# Patient Record
Sex: Female | Born: 1980 | Race: White | Hispanic: No | Marital: Single | Smoking: Current every day smoker
Health system: Southern US, Community
[De-identification: ages and names within clinical notes are randomized; demographics above are authoritative.]

## PROBLEM LIST (undated history)

## (undated) DIAGNOSIS — M542 Cervicalgia: Secondary | ICD-10-CM

## (undated) DIAGNOSIS — R109 Unspecified abdominal pain: Secondary | ICD-10-CM

## (undated) DIAGNOSIS — I471 Supraventricular tachycardia: Secondary | ICD-10-CM

## (undated) DIAGNOSIS — I4719 Other supraventricular tachycardia: Secondary | ICD-10-CM

## (undated) DIAGNOSIS — G8929 Other chronic pain: Secondary | ICD-10-CM

## (undated) HISTORY — PX: INDUCED ABORTION: SHX677

## (undated) HISTORY — PX: ABDOMINAL HYSTERECTOMY: SHX81

## (undated) HISTORY — PX: CHOLECYSTECTOMY: SHX55

---

## 2013-05-08 ENCOUNTER — Emergency Department (HOSPITAL_COMMUNITY): Payer: Self-pay

## 2013-05-08 ENCOUNTER — Emergency Department (HOSPITAL_COMMUNITY)
Admission: EM | Admit: 2013-05-08 | Discharge: 2013-05-08 | Disposition: A | Discharge status: Home / Self Care | Payer: Self-pay | Attending: Emergency Medicine | Admitting: Emergency Medicine

## 2013-05-08 ENCOUNTER — Encounter (HOSPITAL_COMMUNITY): Payer: Self-pay

## 2013-05-08 DIAGNOSIS — Z880 Allergy status to penicillin: Secondary | ICD-10-CM | POA: Insufficient documentation

## 2013-05-08 DIAGNOSIS — Z3202 Encounter for pregnancy test, result negative: Secondary | ICD-10-CM | POA: Insufficient documentation

## 2013-05-08 DIAGNOSIS — R112 Nausea with vomiting, unspecified: Secondary | ICD-10-CM | POA: Insufficient documentation

## 2013-05-08 DIAGNOSIS — K529 Noninfective gastroenteritis and colitis, unspecified: Secondary | ICD-10-CM

## 2013-05-08 DIAGNOSIS — G8929 Other chronic pain: Secondary | ICD-10-CM | POA: Insufficient documentation

## 2013-05-08 DIAGNOSIS — F17200 Nicotine dependence, unspecified, uncomplicated: Secondary | ICD-10-CM | POA: Insufficient documentation

## 2013-05-08 DIAGNOSIS — N83202 Unspecified ovarian cyst, left side: Secondary | ICD-10-CM

## 2013-05-08 DIAGNOSIS — N83209 Unspecified ovarian cyst, unspecified side: Secondary | ICD-10-CM | POA: Insufficient documentation

## 2013-05-08 DIAGNOSIS — K5289 Other specified noninfective gastroenteritis and colitis: Secondary | ICD-10-CM | POA: Insufficient documentation

## 2013-05-08 DIAGNOSIS — R1084 Generalized abdominal pain: Secondary | ICD-10-CM | POA: Insufficient documentation

## 2013-05-08 HISTORY — DX: Other chronic pain: G89.29

## 2013-05-08 HISTORY — DX: Other supraventricular tachycardia: I47.19

## 2013-05-08 HISTORY — DX: Unspecified abdominal pain: R10.9

## 2013-05-08 HISTORY — DX: Supraventricular tachycardia: I47.1

## 2013-05-08 HISTORY — DX: Cervicalgia: M54.2

## 2013-05-08 LAB — URINALYSIS, ROUTINE W REFLEX MICROSCOPIC
Bilirubin Urine: NEGATIVE
Ketones, ur: NEGATIVE mg/dL
Leukocytes, UA: NEGATIVE
Nitrite: NEGATIVE
Protein, ur: NEGATIVE mg/dL
Urobilinogen, UA: 0.2 mg/dL (ref 0.0–1.0)

## 2013-05-08 LAB — CBC WITH DIFFERENTIAL/PLATELET
Basophils Relative: 0 % (ref 0–1)
Eosinophils Absolute: 0.1 10*3/uL (ref 0.0–0.7)
Eosinophils Relative: 1 % (ref 0–5)
Lymphs Abs: 2.1 10*3/uL (ref 0.7–4.0)
MCH: 27 pg (ref 26.0–34.0)
MCHC: 32.3 g/dL (ref 30.0–36.0)
MCV: 83.7 fL (ref 78.0–100.0)
Neutrophils Relative %: 69 % (ref 43–77)
Platelets: 258 10*3/uL (ref 150–400)
RBC: 4.18 MIL/uL (ref 3.87–5.11)

## 2013-05-08 LAB — HEPATIC FUNCTION PANEL
Alkaline Phosphatase: 65 U/L (ref 39–117)
Total Bilirubin: <0.1 mg/dL — ABNORMAL LOW (ref 0.3–1.2)
Total Protein: 8.5 g/dL — ABNORMAL HIGH (ref 6.0–8.3)

## 2013-05-08 LAB — BASIC METABOLIC PANEL
BUN: 6 mg/dL (ref 6–23)
Calcium: 9.1 mg/dL (ref 8.4–10.5)
GFR calc Af Amer: >90 mL/min (ref >90)
GFR calc non Af Amer: >90 mL/min (ref >90)
Glucose, Bld: 91 mg/dL (ref 70–99)
Potassium: 3.4 mEq/L — ABNORMAL LOW (ref 3.5–5.1)
Sodium: 139 mEq/L (ref 135–145)

## 2013-05-08 MED ORDER — ONDANSETRON HCL 4 MG/2ML IJ SOLN
4.0000 mg | INTRAMUSCULAR | Status: DC | PRN
  Administered 2013-05-08: 4 mg via INTRAVENOUS
  Filled 2013-05-08: qty 2

## 2013-05-08 MED ORDER — POTASSIUM CHLORIDE 20 MEQ/15ML (10%) PO LIQD
40.0000 meq | Freq: Once | ORAL | Status: DC
  Filled 2013-05-08: qty 30

## 2013-05-08 MED ORDER — FENTANYL CITRATE 0.05 MG/ML IJ SOLN
50.0000 ug | INTRAMUSCULAR | Status: DC | PRN
  Administered 2013-05-08: 50 ug via INTRAVENOUS
  Filled 2013-05-08 (×2): qty 2

## 2013-05-08 MED ORDER — IOHEXOL 300 MG/ML  SOLN
100.0000 mL | Freq: Once | INTRAMUSCULAR | Status: AC | PRN
  Administered 2013-05-08: 100 mL via INTRAVENOUS

## 2013-05-08 MED ORDER — CIPROFLOXACIN HCL 500 MG PO TABS: 500.0000 mg | ORAL_TABLET | Freq: Two times a day (BID) | ORAL | Status: DC

## 2013-05-08 MED ORDER — IOHEXOL 300 MG/ML  SOLN
50.0000 mL | Freq: Once | INTRAMUSCULAR | Status: AC | PRN
  Administered 2013-05-08: 50 mL via ORAL

## 2013-05-08 MED ORDER — METRONIDAZOLE 500 MG PO TABS: 500.0000 mg | ORAL_TABLET | Freq: Three times a day (TID) | ORAL | Status: DC

## 2013-05-08 MED ORDER — SODIUM CHLORIDE 0.9 % IV SOLN
INTRAVENOUS | Status: DC
  Administered 2013-05-08: 16:00:00 via INTRAVENOUS

## 2013-05-08 MED ORDER — POTASSIUM CHLORIDE CRYS ER 20 MEQ PO TBCR
EXTENDED_RELEASE_TABLET | ORAL | Status: AC
  Administered 2013-05-08: 40 meq
  Filled 2013-05-08: qty 2

## 2013-05-08 MED ORDER — HYDROCODONE-ACETAMINOPHEN 5-325 MG PO TABS: ORAL_TABLET | ORAL | Status: DC

## 2013-05-08 MED ORDER — NAPROXEN 250 MG PO TABS: 250.0000 mg | ORAL_TABLET | Freq: Two times a day (BID) | ORAL | Status: DC

## 2013-05-08 MED ORDER — FENTANYL CITRATE 0.05 MG/ML IJ SOLN
50.0000 ug | INTRAMUSCULAR | Status: DC | PRN
  Administered 2013-05-08: 50 ug via INTRAVENOUS

## 2013-05-08 NOTE — ED Provider Notes (Signed)
CSN: [id number]     Arrival date & time 05/08/13  1252 History   First MD Initiated Contact with Patient 05/08/13 1447     Chief Complaint  Patient presents with  . Abdominal Pain    HPI Pt was seen at 1455.  Per pt, c/o gradual onset and persistence of constant acute flair of her chronic generalized abd "pain" for the past 2.5 years. States the pain began approx 6 months after she had a hysterectomy 2.5 years ago. Has been associated with nausea, vomited x1 today.  Describes the abd pain as "squeezing," and "sharp." Pt states she has not followed up with any MD for same, including the OB/GYN who performed her hysterectomy.  Denies diarrhea, no fevers, no back pain, no rash, no CP/SOB, no black or blood in stools or emesis, no vaginal bleeding/discharge, no dysuria/hematuria.    Past Medical History  Diagnosis Date  . Chronic abdominal pain   . Atrial tachycardia   . Chronic neck pain    Past Surgical History  Procedure Laterality Date  . Abdominal hysterectomy    . Induced abortion    . Cholecystectomy      History  Substance Use Topics  . Smoking status: Current Every Day Smoker    Types: Cigarettes  . Smokeless tobacco: Not on file  . Alcohol Use: Yes    Review of Systems ROS: Statement: All systems negative except as marked or noted in the HPI; Constitutional: Negative for fever and chills. ; ; Eyes: Negative for eye pain, redness and discharge. ; ; ENMT: Negative for ear pain, hoarseness, nasal congestion, sinus pressure and sore throat. ; ; Cardiovascular: Negative for chest pain, palpitations, diaphoresis, dyspnea and peripheral edema. ; ; Respiratory: Negative for cough, wheezing and stridor. ; ; Gastrointestinal: +N/V, abd pain. Negative for diarrhea, blood in stool, hematemesis, jaundice and rectal bleeding. . ; ; Genitourinary: Negative for dysuria, flank pain and hematuria. ; ; GYN:  No vaginal bleeding, no vaginal discharge, no vulvar pain.;; Musculoskeletal: Negative  for back pain and neck pain. Negative for swelling and trauma.; ; Skin: Negative for pruritus, rash, abrasions, blisters, bruising and skin lesion.; ; Neuro: Negative for headache, lightheadedness and neck stiffness. Negative for weakness, altered level of consciousness , altered mental status, extremity weakness, paresthesias, involuntary movement, seizure and syncope.        Allergies  Penicillins; Amoxicillin; and Orange fruit  Home Medications  No current outpatient prescriptions on file. BP 124/86  Pulse 112  Temp(Src) 98 F (36.7 C) (Oral)  Resp 20  Ht 5\' 2"  (1.575 m)  Wt 125 lb (56.7 kg)  BMI 22.86 kg/m2  SpO2 99% BP 113/74  Pulse 99  Temp(Src) 98.5 F (36.9 C) (Oral)  Resp 18  Ht 5\' 2"  (1.575 m)  Wt 125 lb (56.7 kg)  BMI 22.86 kg/m2  SpO2 100%  Physical Exam 1500: Physical examination:  Nursing notes reviewed; Vital signs and O2 SAT reviewed;  Constitutional: Well developed, Well nourished, Well hydrated, In no acute distress; Head:  Normocephalic, atraumatic; Eyes: EOMI, PERRL, No scleral icterus; ENMT: Mouth and pharynx normal, Mucous membranes moist; Neck: Supple, Full range of motion, No lymphadenopathy; Cardiovascular: Regular rate and rhythm, No murmur, rub, or gallop; Respiratory: Breath sounds clear & equal bilaterally, No rales, rhonchi, wheezes.  Speaking full sentences with ease, Normal respiratory effort/excursion; Chest: Nontender, Movement normal; Abdomen: Soft, Nontender, Nondistended, Normal bowel sounds; Genitourinary: No CVA tenderness; Extremities: Pulses normal, No tenderness, No edema, No calf edema or  asymmetry.; Neuro: AA&Ox3, Major CN grossly intact.  Speech clear. Climbs on and off stretcher easily by herself. Gait upright and steady. No gross focal motor or sensory deficits in extremities.; Skin: Color normal, Warm, Dry.   ED Course  Procedures     MDM  MDM Reviewed: nursing note and vitals Interpretation: labs, CT scan and  ultrasound     Results for orders placed during the hospital encounter of 05/08/13  URINALYSIS, ROUTINE W REFLEX MICROSCOPIC      Result Value Range   Color, Urine YELLOW  YELLOW   APPearance CLEAR  CLEAR   Specific Gravity, Urine 1.025  1.005 - 1.030   pH 7.0  5.0 - 8.0   Glucose, UA NEGATIVE  NEGATIVE mg/dL   Hgb urine dipstick NEGATIVE  NEGATIVE   Bilirubin Urine NEGATIVE  NEGATIVE   Ketones, ur NEGATIVE  NEGATIVE mg/dL   Protein, ur NEGATIVE  NEGATIVE mg/dL   Urobilinogen, UA 0.2  0.0 - 1.0 mg/dL   Nitrite NEGATIVE  NEGATIVE   Leukocytes, UA NEGATIVE  NEGATIVE  PREGNANCY, URINE      Result Value Range   Preg Test, Ur NEGATIVE  NEGATIVE  CBC WITH DIFFERENTIAL      Result Value Range   WBC 8.1  4.0 - 10.5 K/uL   RBC 4.18  3.87 - 5.11 MIL/uL   Hemoglobin 11.3 (*) 12.0 - 15.0 g/dL   HCT 16.1 (*) 09.6 - 04.5 %   MCV 83.7  78.0 - 100.0 fL   MCH 27.0  26.0 - 34.0 pg   MCHC 32.3  30.0 - 36.0 g/dL   RDW 40.9  81.1 - 91.4 %   Platelets 258  150 - 400 K/uL   Neutrophils Relative % 69  43 - 77 %   Neutro Abs 5.6  1.7 - 7.7 K/uL   Lymphocytes Relative 26  12 - 46 %   Lymphs Abs 2.1  0.7 - 4.0 K/uL   Monocytes Relative 4  3 - 12 %   Monocytes Absolute 0.3  0.1 - 1.0 K/uL   Eosinophils Relative 1  0 - 5 %   Eosinophils Absolute 0.1  0.0 - 0.7 K/uL   Basophils Relative 0  0 - 1 %   Basophils Absolute 0.0  0.0 - 0.1 K/uL  BASIC METABOLIC PANEL      Result Value Range   Sodium 139  135 - 145 mEq/L   Potassium 3.4 (*) 3.5 - 5.1 mEq/L   Chloride 102  96 - 112 mEq/L   CO2 24  19 - 32 mEq/L   Glucose, Bld 91  70 - 99 mg/dL   BUN 6  6 - 23 mg/dL   Creatinine, Ser 7.82  0.50 - 1.10 mg/dL   Calcium 9.1  8.4 - 95.6 mg/dL   GFR calc non Af Amer >90  >90 mL/min   GFR calc Af Amer >90  >90 mL/min  LIPASE, BLOOD      Result Value Range   Lipase 22  11 - 59 U/L  HEPATIC FUNCTION PANEL      Result Value Range   Total Protein 8.5 (*) 6.0 - 8.3 g/dL   Albumin 4.1  3.5 - 5.2 g/dL   AST  23  0 - 37 U/L   ALT 12  0 - 35 U/L   Alkaline Phosphatase 65  39 - 117 U/L   Total Bilirubin <0.1 (*) 0.3 - 1.2 mg/dL   Bilirubin, Direct <2.1  0.0 -  0.3 mg/dL   Indirect Bilirubin NOT CALCULATED  0.3 - 0.9 mg/dL   Ct Abdomen Pelvis W Contrast 05/08/2013   CLINICAL DATA:  Abdominal pain  EXAM: CT ABDOMEN AND PELVIS WITH CONTRAST  TECHNIQUE: Multidetector CT imaging of the abdomen and pelvis was performed using the standard protocol following bolus administration of intravenous contrast. Oral contrast was also administered.  CONTRAST:  OMNIPAQUE IOHEXOL 300 MG/ML  SOLN  COMPARISON:  None.  FINDINGS: Lung bases are clear.  There is a 1.2 x 0.7 cm apparent cyst in the anterior segment of the right lobe of the liver. No other liver lesions are identified. There is no biliary duct dilatation. Gallbladder is absent.  Spleen, pancreas, and adrenals appear normal. Kidneys bilaterally show no mass or hydronephrosis on either side.  In the pelvis, the uterus is absent. Within the left ovary, there is a 2.9 x 2.5 cm mass with a somewhat irregularly enhancing wall. There is no other pelvic mass. There is no pelvic fluid. Appendix is not convincingly seen. There is no periappendiceal region inflammatory change.  There is no bowel obstruction. No free air or portal venous air.  Several areas of mild wall thickening are noted in the descending and proximal sigmoid colon, probably representing a degree of colitis. There is no surrounding mesenteric inflammation or abscess, however.  There is no ascites, adenopathy, or abscess in the abdomen or pelvis. Aorta is non aneurysmal. There are no blastic or lytic bone lesions.  There is mild colonic wall thickening  IMPRESSION: Small mass in the left ovary with a slightly irregularly enhancing wall. Suspect hemorrhagic cyst. This finding may warrant correlation with pelvic ultrasound to further assess, however.  Mild wall thickening in portions of the left colon, upright  representing a degree of colitis. No diverticulitis seen.  No abscess. No bowel obstruction.  Appendix is not convincingly seen. No periappendiceal region inflammation identified.  Gallbladder and uterus absent.  Small cyst in liver.   Electronically Signed   By: Bretta Bang   On: 05/08/2013 16:43   [place] Art/ven Flow Abd Pelv Doppler 05/08/2013   CLINICAL DATA:  Pelvic pain. Evaluate for potential ovarian torsion.  EXAM: TRANSABDOMINAL AND TRANSVAGINAL ULTRASOUND OF PELVIS  DOPPLER ULTRASOUND OF OVARIES  TECHNIQUE: Both transabdominal and transvaginal ultrasound examinations of the pelvis were performed. Transabdominal technique was performed for global imaging of the pelvis including uterus, ovaries, adnexal regions, and pelvic cul-de-sac.  It was necessary to proceed with endovaginal exam following the transabdominal exam to visualize the ovaries. Color and duplex Doppler ultrasound was utilized to evaluate blood flow to the ovaries.  COMPARISON:  CT of the abdomen and pelvis 05/08/2013.  FINDINGS: Uterus  Status post hysterectomy.  Right ovary  Right ovary could not be visualized.  Left ovary  Measurements: 4.5 x 2.3 x 3.4 cm. Within the right ovary there is a complex area of heterogeneous echotexture measuring 2.6 x 2.0 x 2.9 cm.  Pulsed Doppler evaluation of both ovaries demonstrates normal low-resistance arterial and venous waveforms.  No free fluid within the cul-de-sac.  IMPRESSION: 1. No evidence of left ovarian torsion. 2. Limited examination which did not visualize the right ovary. 3. Complex lesion in the left ovary has imaging characteristics that likely represent a degenerating corpus luteum cyst. 4. Status post hysterectomy.   Electronically Signed   By: Trudie Reed M.D.   On: 05/08/2013 18:16     1820:  Has tol PO well without N/V. No stooling while in the  ED.  Abd continues benign, VSS. Wants to go home now. Potassium repleted PO. CT questions mild colitis, will tx with abx. Long hx  of chronic pain.  Pt endorses acute flair of her usual long standing chronic pain today, no change from her usual chronic pain pattern.  Pt encouraged to f/u with her PMD, and OB/GYN doctors for good continuity of care and control of her chronic pain.  Verb understanding. Dx and testing d/w pt.  Questions answered.  Verb understanding, agreeable to d/c home with outpt f/u.      Laray Anger, DO 05/10/13 1240

## 2013-05-08 NOTE — ED Notes (Signed)
Pt reports ab pain that comes/goes since having hysterectomy 2 1/2 years ago. Vomited x1-d/t pain, no diarrhea, no fever.  Pain became severe today. Denies any urinary s/s. No pmd.

## 2013-11-23 ENCOUNTER — Emergency Department (HOSPITAL_COMMUNITY)
Admission: EM | Admit: 2013-11-23 | Discharge: 2013-11-23 | Disposition: A | Discharge status: Home / Self Care | Payer: Self-pay | Attending: Emergency Medicine | Admitting: Emergency Medicine

## 2013-11-23 ENCOUNTER — Emergency Department (HOSPITAL_COMMUNITY): Payer: Self-pay

## 2013-11-23 ENCOUNTER — Encounter (HOSPITAL_COMMUNITY): Payer: Self-pay | Admitting: Emergency Medicine

## 2013-11-23 DIAGNOSIS — F17200 Nicotine dependence, unspecified, uncomplicated: Secondary | ICD-10-CM | POA: Insufficient documentation

## 2013-11-23 DIAGNOSIS — M25511 Pain in right shoulder: Secondary | ICD-10-CM

## 2013-11-23 DIAGNOSIS — Y929 Unspecified place or not applicable: Secondary | ICD-10-CM | POA: Insufficient documentation

## 2013-11-23 DIAGNOSIS — G8929 Other chronic pain: Secondary | ICD-10-CM | POA: Insufficient documentation

## 2013-11-23 DIAGNOSIS — Z79899 Other long term (current) drug therapy: Secondary | ICD-10-CM | POA: Insufficient documentation

## 2013-11-23 DIAGNOSIS — Z880 Allergy status to penicillin: Secondary | ICD-10-CM | POA: Insufficient documentation

## 2013-11-23 DIAGNOSIS — Y93F2 Activity, caregiving, lifting: Secondary | ICD-10-CM | POA: Insufficient documentation

## 2013-11-23 DIAGNOSIS — X500XXA Overexertion from strenuous movement or load, initial encounter: Secondary | ICD-10-CM | POA: Insufficient documentation

## 2013-11-23 DIAGNOSIS — S139XXA Sprain of joints and ligaments of unspecified parts of neck, initial encounter: Secondary | ICD-10-CM | POA: Insufficient documentation

## 2013-11-23 DIAGNOSIS — Z792 Long term (current) use of antibiotics: Secondary | ICD-10-CM | POA: Insufficient documentation

## 2013-11-23 DIAGNOSIS — R000 Tachycardia, unspecified: Secondary | ICD-10-CM | POA: Insufficient documentation

## 2013-11-23 DIAGNOSIS — S161XXA Strain of muscle, fascia and tendon at neck level, initial encounter: Secondary | ICD-10-CM

## 2013-11-23 MED ORDER — OXYCODONE-ACETAMINOPHEN 5-325 MG PO TABS
1.0000 | ORAL_TABLET | Freq: Once | ORAL | Status: AC
Start: 2013-11-23 — End: 2013-11-23
  Administered 2013-11-23: 1 via ORAL
  Filled 2013-11-23: qty 1

## 2013-11-23 MED ORDER — OXYCODONE-ACETAMINOPHEN 5-325 MG PO TABS: 1.0000 | ORAL_TABLET | ORAL | Status: DC | PRN

## 2013-11-23 MED ORDER — CYCLOBENZAPRINE HCL 10 MG PO TABS: 10.0000 mg | ORAL_TABLET | Freq: Two times a day (BID) | ORAL | Status: DC | PRN
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
## 2013-11-23 NOTE — ED Provider Notes (Signed)
Medical screening examination/treatment/procedure(s) were performed by non-physician practitioner and as supervising physician I was immediately available for consultation/collaboration.   EKG Interpretation None        Jazarah Capili W Tyesha Joffe, MD 11/23/13 1849 
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
## 2014-05-20 ENCOUNTER — Emergency Department (HOSPITAL_COMMUNITY)
Admission: EM | Admit: 2014-05-20 | Discharge: 2014-05-20 | Disposition: A | Discharge status: Home / Self Care | Payer: Self-pay | Attending: Emergency Medicine | Admitting: Emergency Medicine

## 2014-05-20 ENCOUNTER — Emergency Department (HOSPITAL_COMMUNITY): Payer: Self-pay

## 2014-05-20 ENCOUNTER — Encounter (HOSPITAL_COMMUNITY): Payer: Self-pay | Admitting: Emergency Medicine

## 2014-05-20 DIAGNOSIS — G8929 Other chronic pain: Secondary | ICD-10-CM | POA: Insufficient documentation

## 2014-05-20 DIAGNOSIS — S6990XA Unspecified injury of unspecified wrist, hand and finger(s), initial encounter: Secondary | ICD-10-CM | POA: Insufficient documentation

## 2014-05-20 DIAGNOSIS — S6721XA Crushing injury of right hand, initial encounter: Secondary | ICD-10-CM

## 2014-05-20 DIAGNOSIS — Z79899 Other long term (current) drug therapy: Secondary | ICD-10-CM | POA: Insufficient documentation

## 2014-05-20 DIAGNOSIS — W230XXA Caught, crushed, jammed, or pinched between moving objects, initial encounter: Secondary | ICD-10-CM | POA: Insufficient documentation

## 2014-05-20 DIAGNOSIS — Y929 Unspecified place or not applicable: Secondary | ICD-10-CM | POA: Insufficient documentation

## 2014-05-20 DIAGNOSIS — F17200 Nicotine dependence, unspecified, uncomplicated: Secondary | ICD-10-CM | POA: Insufficient documentation

## 2014-05-20 DIAGNOSIS — Z8679 Personal history of other diseases of the circulatory system: Secondary | ICD-10-CM | POA: Insufficient documentation

## 2014-05-20 DIAGNOSIS — Y9389 Activity, other specified: Secondary | ICD-10-CM | POA: Insufficient documentation

## 2014-05-20 DIAGNOSIS — Z880 Allergy status to penicillin: Secondary | ICD-10-CM | POA: Insufficient documentation

## 2014-05-20 MED ORDER — KETOROLAC TROMETHAMINE 60 MG/2ML IM SOLN
60.0000 mg | Freq: Once | INTRAMUSCULAR | Status: AC
  Administered 2014-05-20: 60 mg via INTRAMUSCULAR
  Filled 2014-05-20: qty 2

## 2014-05-20 MED ORDER — OXYCODONE-ACETAMINOPHEN 5-325 MG PO TABS: 1.0000 | ORAL_TABLET | ORAL | Status: AC | PRN

## 2014-05-20 MED ORDER — OXYCODONE-ACETAMINOPHEN 5-325 MG PO TABS
1.0000 | ORAL_TABLET | Freq: Once | ORAL | Status: AC
  Administered 2014-05-20: 1 via ORAL
  Filled 2014-05-20: qty 1

## 2014-05-20 NOTE — ED Notes (Signed)
PT got her right hand caught in between two doors yesterday. Right hand noted to be swollen, decreased ROM and scabs to right hand.

## 2014-05-20 NOTE — Discharge Instructions (Signed)
Crush Injury, Fingers or Toes A crush injury to the fingers or toes means the tissues have been damaged by being squeezed (compressed). There will be bleeding into the tissues and swelling. Often, blood will collect under the skin. When this happens, the skin on the finger often dies and may slough off (shed) 1 week to 10 days later. Usually, new skin is growing underneath. If the injury has been too severe and the tissue does not survive, the damaged tissue may begin to turn black over several days.  Wounds which occur because of the crushing may be stitched (sutured) shut. However, crush injuries are more likely to become infected than other injuries.These wounds may not be closed as tightly as other types of cuts to prevent infection. Nails involved are often lost. These usually grow back over several weeks.  DIAGNOSIS X-rays may be taken to see if there is any injury to the bones. TREATMENT Broken bones (fractures) may be treated with splinting, depending on the fracture. Often, no treatment is required for fractures of the last bone in the fingers or toes. HOME CARE INSTRUCTIONS   The crushed part should be raised (elevated) above the heart or center of the chest as much as possible for the first several days or as directed. This helps with pain and lessens swelling. Less swelling increases the chances that the crushed part will survive.  Put ice on the injured area.  Put ice in a plastic bag.  Place a towel between your skin and the bag.  Leave the ice on for 15-20 minutes, 03-04 times a day for the first 2 days.  Only take over-the-counter or prescription medicines for pain, discomfort, or fever as directed by your caregiver.  Use your injured part only as directed.  Change your bandages (dressings) as directed.  Keep all follow-up appointments as directed by your caregiver. Not keeping your appointment could result in a chronic or permanent injury, pain, and disability. If there is  any problem keeping the appointment, you must call to reschedule. SEEK IMMEDIATE MEDICAL CARE IF:   There is redness, swelling, or increasing pain in the wound area.  Pus is coming from the wound.  You have a fever.  You notice a bad smell coming from the wound or dressing.  The edges of the wound do not stay together after the sutures have been removed.  You are unable to move the injured finger or toe. MAKE SURE YOU:   Understand these instructions.  Will watch your condition.  Will get help right away if you are not doing well or get worse. Document Released: 08/08/2005 Document Revised: 10/31/2011 Document Reviewed: 12/24/2010 [facility] Patient Information 2015 [employer] [place]. This information is not intended to replace advice given to you by your health care provider. Make sure you discuss any questions you have with your health care provider.   You may take the oxycodone prescribed for pain relief.  This will make you drowsy - do not drive within 4 hours of taking this medication. Start taking your naproxen as discussed.  Ice and elevation will help with pain and swelling.

## 2014-05-21 NOTE — ED Provider Notes (Signed)
Medical screening examination/treatment/procedure(s) were performed by non-physician practitioner and as supervising physician I was immediately available for consultation/collaboration.   EKG Interpretation None        Jasun Gasparini W Nashawn Hillock, MD 05/21/14 1443 

## 2014-05-21 NOTE — ED Provider Notes (Signed)
CSN: [id number]     Arrival date & time 05/20/14  0944 History   First MD Initiated Contact with Patient 05/20/14 [phone]     Chief Complaint  Patient presents with  . Hand Injury     (Consider location/radiation/quality/duration/timing/severity/associated sxs/prior Treatment) The history is provided by the patient.   Thomes LollingJamie Mullet is a 33 y.o.right handed female presenting with swelling and pain which is increased with attempts to flex and extend her fingers since she caught the proximal fingers excluding her thumb in an automatic door at a local assisted living facility yesterday.  Her pain is constant, aching and throbbing and not relieved with elevation and tramadol.  She has several abrasions across her fingers which she has not cleaned due to pain but have stopped bleeding spontaneously.  She is utd with her tetanus.  There is no radiation of pain beyond her hand.     Past Medical History  Diagnosis Date  . Chronic abdominal pain   . Atrial tachycardia   . Chronic neck pain    Past Surgical History  Procedure Laterality Date  . Abdominal hysterectomy    . Induced abortion    . Cholecystectomy     No family history on file. History  Substance Use Topics  . Smoking status: Current Every Day Smoker -- 0.50 packs/day    Types: Cigarettes  . Smokeless tobacco: Not on file  . Alcohol Use: Yes   OB History   Grav Para Term Preterm Abortions TAB SAB Ect Mult Living                 Review of Systems  Constitutional: Negative for fever.  Musculoskeletal: Positive for arthralgias and joint swelling. Negative for myalgias.  Skin: Positive for wound.  Neurological: Negative for weakness and numbness.      Allergies  Penicillins; Amoxicillin; and Orange fruit  Home Medications   Prior to Admission medications   Medication Sig Start Date End Date Taking? Authorizing Provider  traMADol (ULTRAM) 50 MG tablet Take 50 mg by mouth every 6 (six) hours as needed (pain).   Yes  Historical Provider, MD  oxyCODONE-acetaminophen (PERCOCET/ROXICET) 5-325 MG per tablet Take 1 tablet by mouth every 4 (four) hours as needed. 05/20/14   Burgess AmorJulie Abou Sterkel, PA-C   BP 125/91  Pulse 97  Temp(Src) 97.6 F (36.4 C) (Oral)  Resp 18  Ht 5\' 2"  (1.575 m)  Wt 115 lb (52.164 kg)  BMI 21.03 kg/m2  SpO2 95% Physical Exam  Constitutional: She appears well-developed and well-nourished.  HENT:  Head: Atraumatic.  Neck: Normal range of motion.  Cardiovascular:  Pulses equal bilaterally  Musculoskeletal: She exhibits tenderness.       Right hand: She exhibits bony tenderness and swelling. She exhibits normal capillary refill and no deformity. Normal sensation noted. Decreased strength noted.  Pt with pain with palpation across proximal 2-5 fingers and mcp joints of right hand, modest edema.  Increased pain without radiation with active or passive ROM.  Distal sensation intact with less than 2 sec cap refill in fingers.  Multiple small abrasions dorsally which are hemostatic.  No deformity.  Neurological: She is alert. She has normal strength. She displays normal reflexes. No sensory deficit.  Skin: Skin is warm and dry.  Psychiatric: She has a normal mood and affect.    ED Course  Procedures (including critical care time) Labs Review Labs Reviewed - No data to display  Imaging Review Dg Hand Complete Right  05/20/2014   CLINICAL  DATA:  Right hand trauma last evening now with persistent pain in the third and fourth and fifth digits  EXAM: RIGHT HAND - COMPLETE 3+ VIEW  COMPARISON:  None.  FINDINGS: The bones of the right hand are adequately mineralized. There is no acute fracture nor dislocation. The interphalangeal and metacarpophalangeal joints exhibit no acute abnormalities or significant degenerative change. The soft tissues are unremarkable.  IMPRESSION: There is no acute bony abnormality of the right hand.   Electronically Signed   By: David  [place]   On: 05/20/2014 10:39     EKG  Interpretation None      MDM   Final diagnoses:  Crush injury of hand, right, initial encounter   Patients labs and/or radiological studies were viewed and considered during the medical decision making and disposition process.  Crush injury of fingers of right hand, excluding thumb with no fractures on xrays.  Hand is soft, no current findings to suggest compartment syndrome.  This condition was discussed with pt. Advised immediate f/u here for any worsened sx to suggest this.  Referral given to ortho for f/u care if sx are not improving with time. Rest, ice,  Elevation recommended.    Abrasions cleaned and dressed by RN.  Pt given oxycodone, script for same.    Burgess Amor, PA-C 05/21/14 1418

## 2014-11-02 IMAGING — US US ART/VEN ABD/PELV/SCROTUM DOPPLER LTD
1 series · 13 of 25 positions shown · non-contrast
Comparison: CT of the abdomen and pelvis 05/08/2013.

CLINICAL DATA: Pelvic pain. Evaluate for potential ovarian torsion.

EXAM:
TRANSABDOMINAL AND TRANSVAGINAL ULTRASOUND OF PELVIS
DOPPLER ULTRASOUND OF OVARIES
TECHNIQUE: Both transabdominal and transvaginal ultrasound examinations of the
pelvis were performed. Transabdominal technique was performed for
global imaging of the pelvis including uterus, ovaries, adnexal
regions, and pelvic cul-de-sac.
It was necessary to proceed with endovaginal exam following the
transabdominal exam to visualize the ovaries. Color and duplex
Doppler ultrasound was utilized to evaluate blood flow to the
ovaries.

[Series 1: us art/ven abd/pelv/scrotum doppler ltd · 0.20mm/px · 13 of 55 slices shown]
[im 1/55]
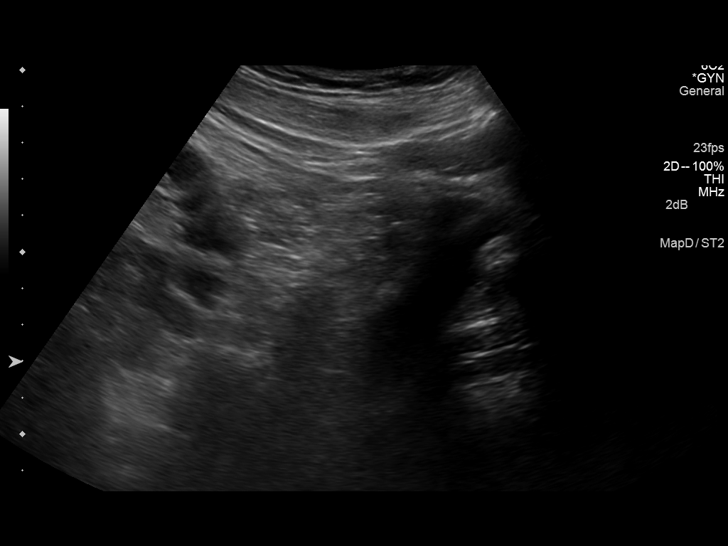
[im 5/55]
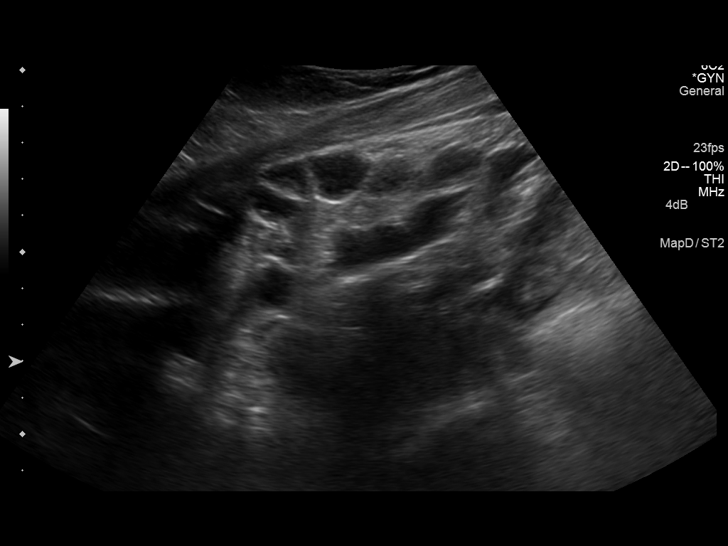
[im 10/55]
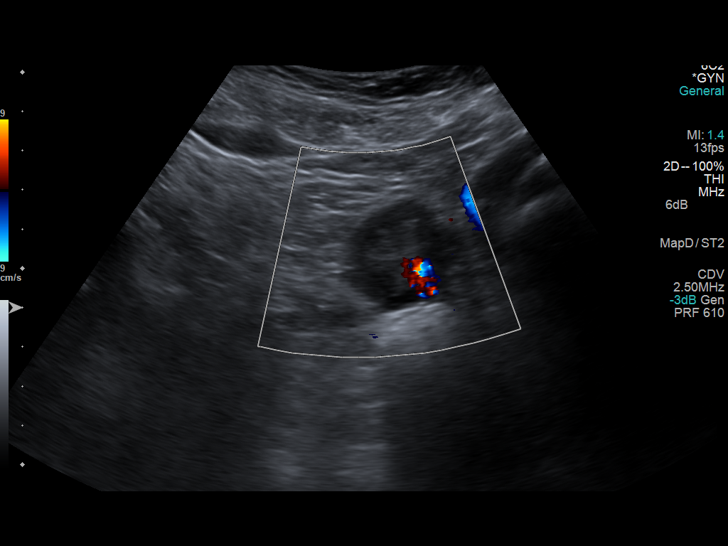
[im 14/55]
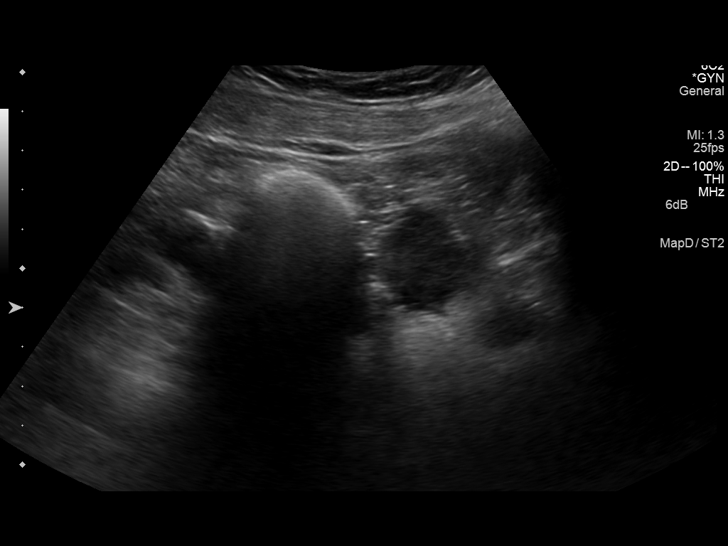
[im 19/55]
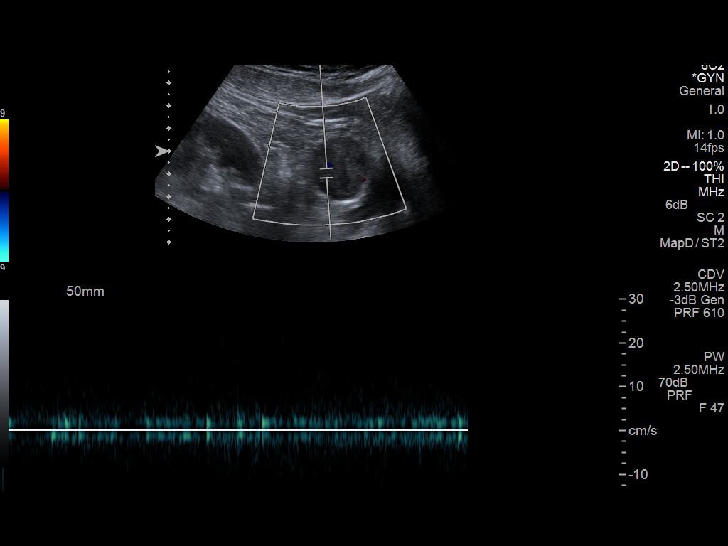
[im 23/55]
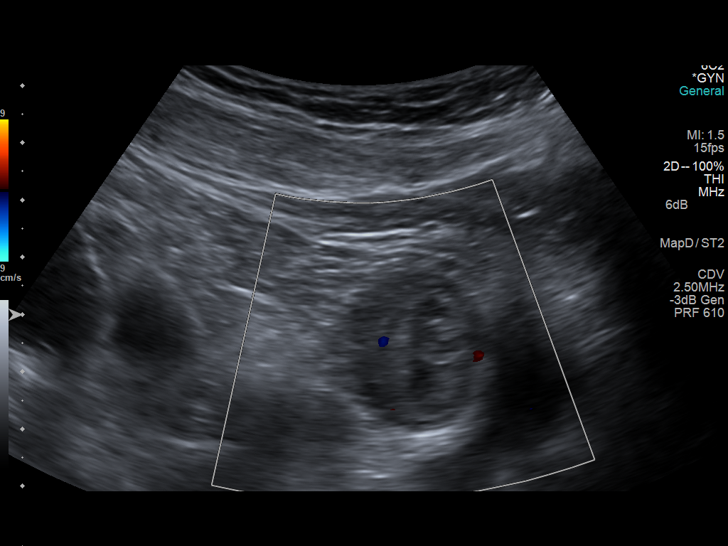
[im 28/55]
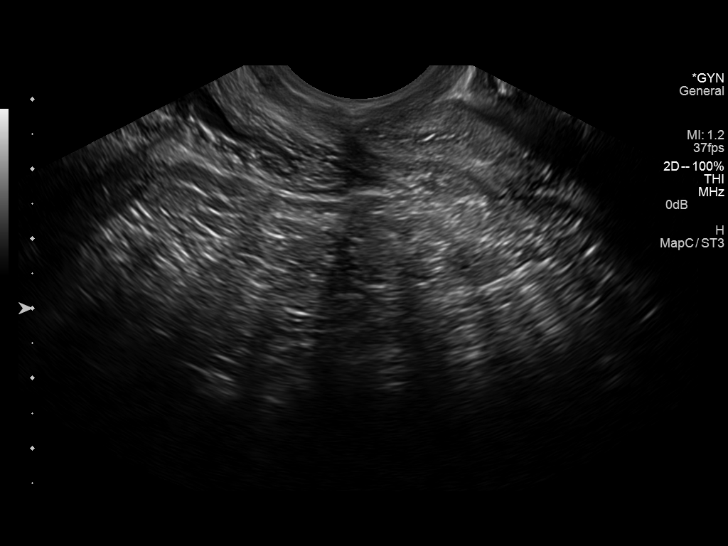
[im 32/55]
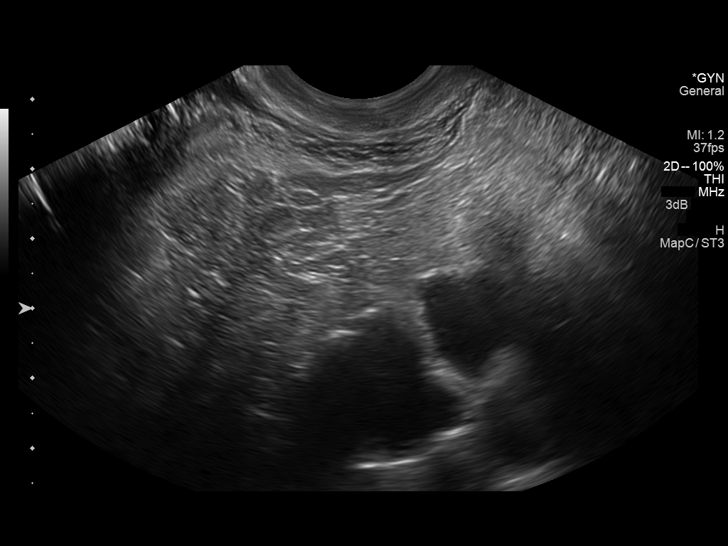
[im 37/55]
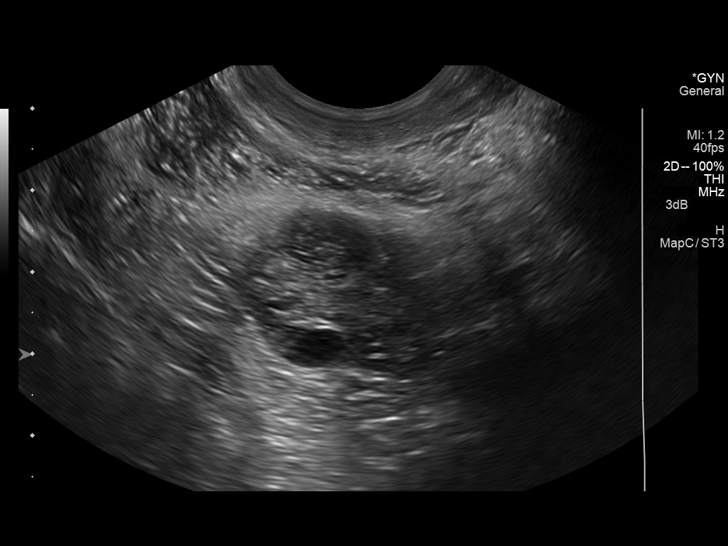
[im 41/55]
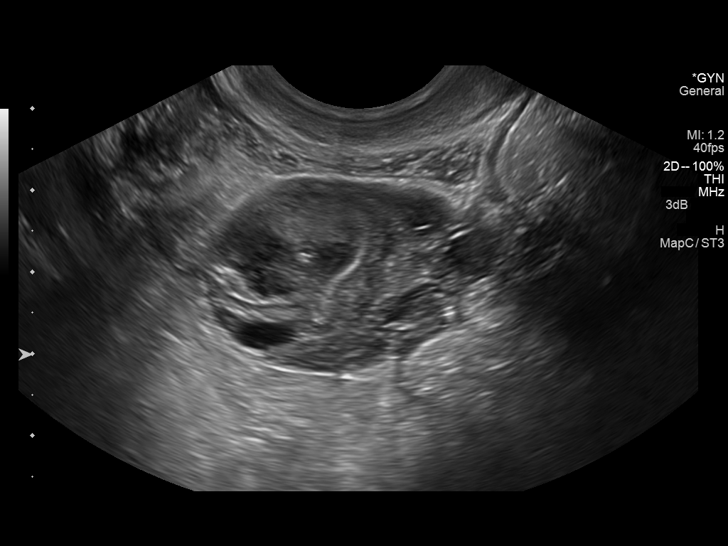
[im 46/55]
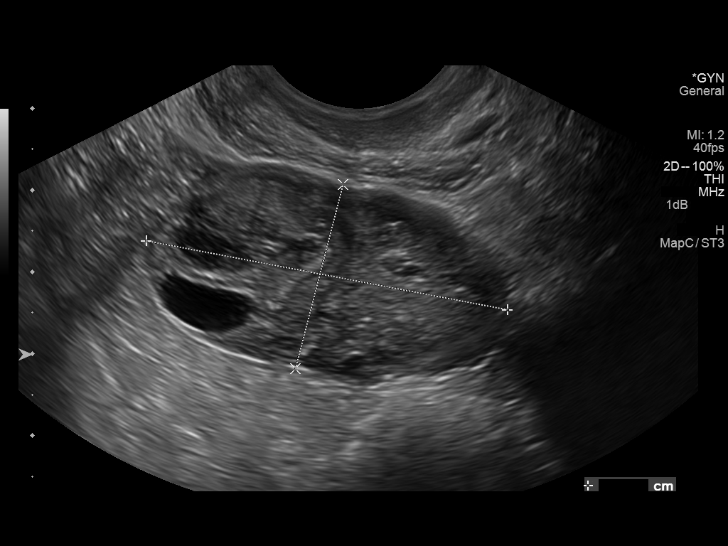
[im 50/55]
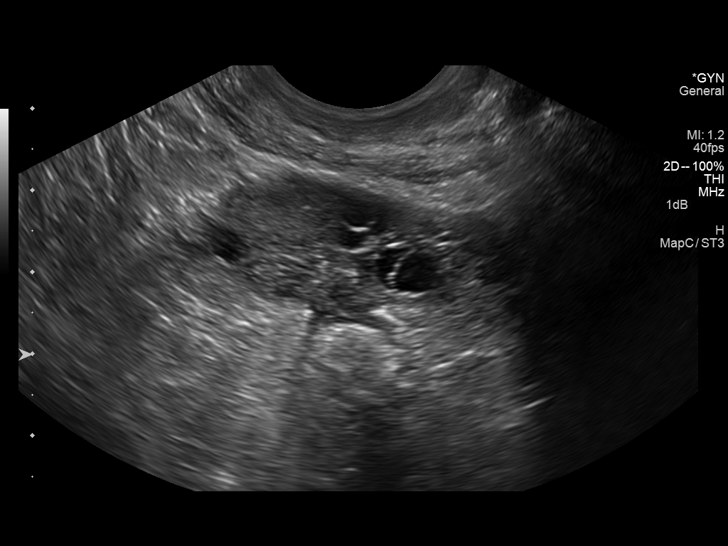
[im 55/55]
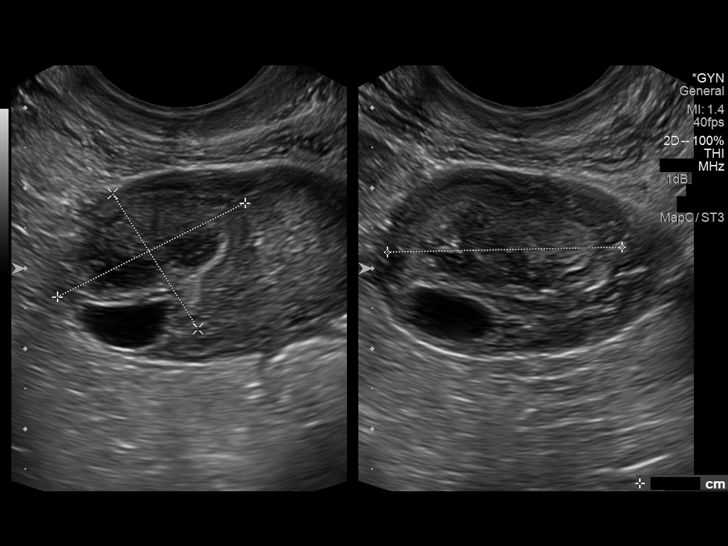

[13 of 25 positions shown; findings below may reference images not displayed]

FINDINGS: Uterus

Status post hysterectomy.

Right ovary

Right ovary could not be visualized.

Left ovary

Measurements: 4.5 x 2.3 x 3.4 cm. Within the right ovary there is a
complex area of heterogeneous echotexture measuring 2.6 x 2.0 x
cm.

Pulsed Doppler evaluation of both ovaries demonstrates normal
low-resistance arterial and venous waveforms.

No free fluid within the cul-de-sac.
IMPRESSION: 1. No evidence of left ovarian torsion.
2. Limited examination which did not visualize the right ovary.
3. Complex lesion in the left ovary has imaging characteristics that
likely represent a degenerating corpus luteum cyst.
4. Status post hysterectomy.

## 2015-11-14 IMAGING — CR DG HAND COMPLETE 3+V*R*
3 series · 3 of 3 positions shown · non-contrast
Comparison: None.

CLINICAL DATA: Right hand trauma last evening now with persistent
pain in the third and fourth and fifth digits

EXAM:
RIGHT HAND - COMPLETE 3+ VIEW

[view not recorded (1 of 3)]
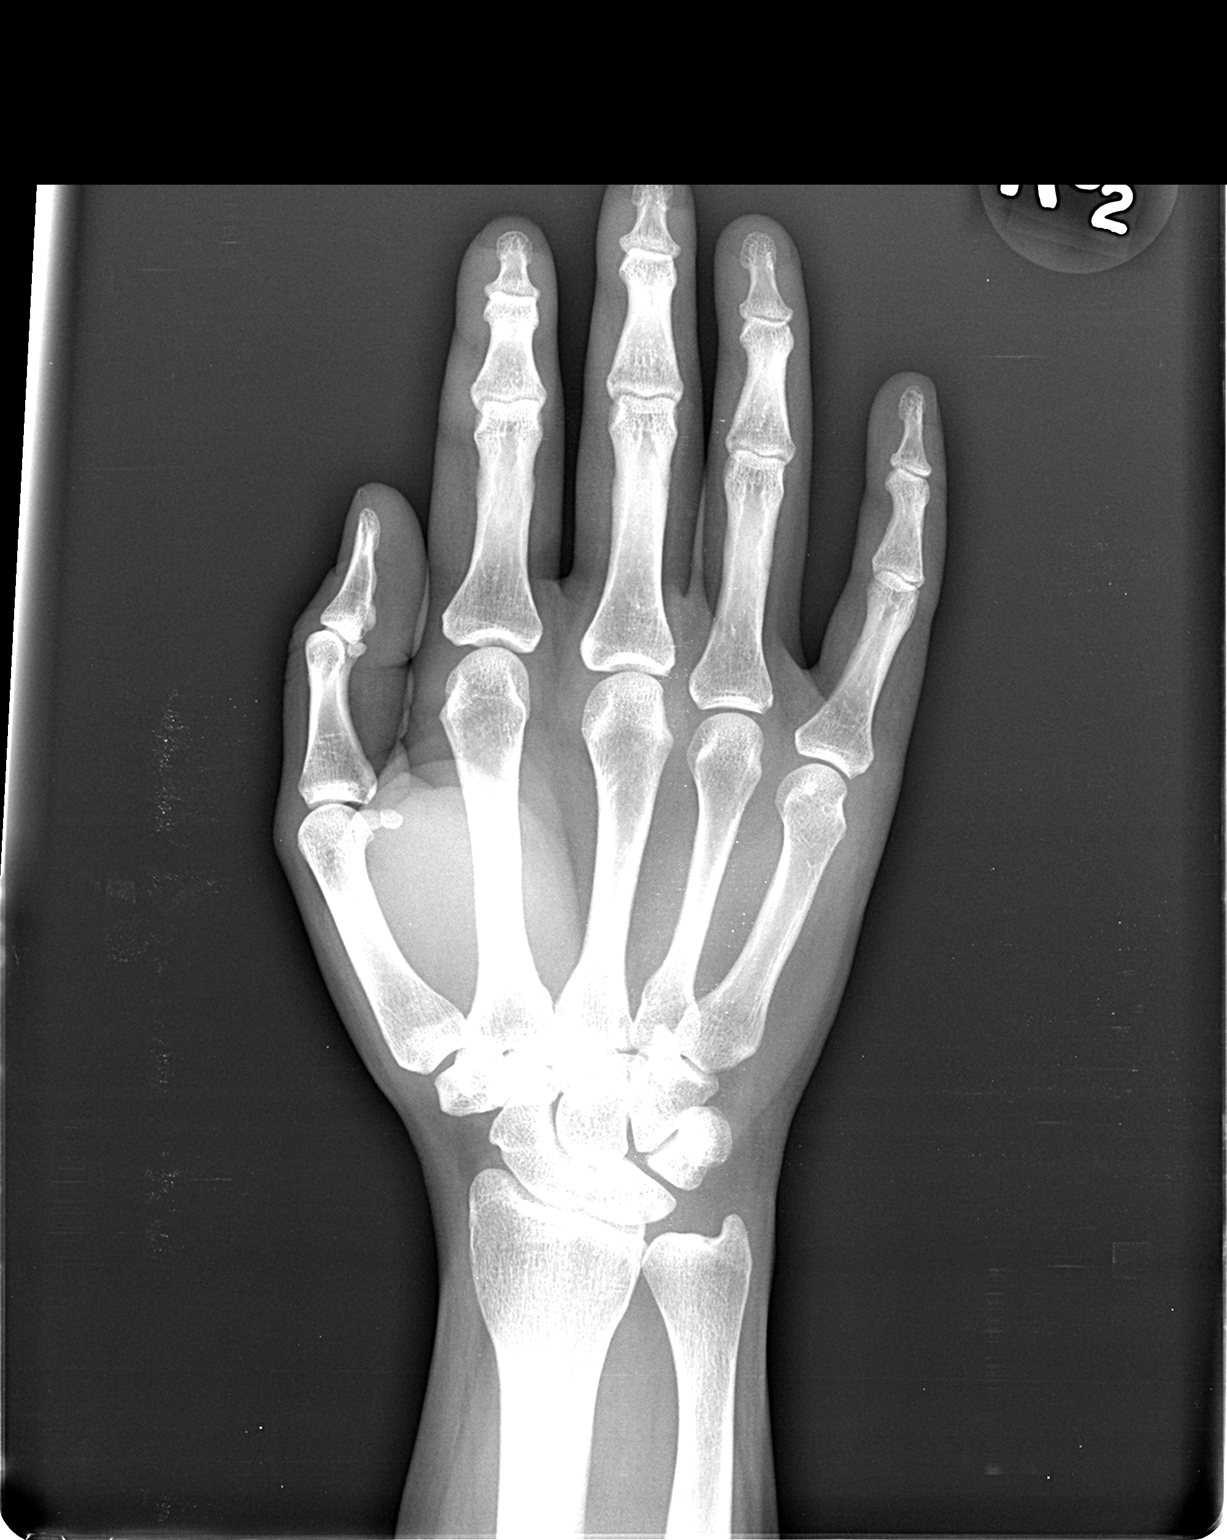

[view not recorded (2 of 3)]
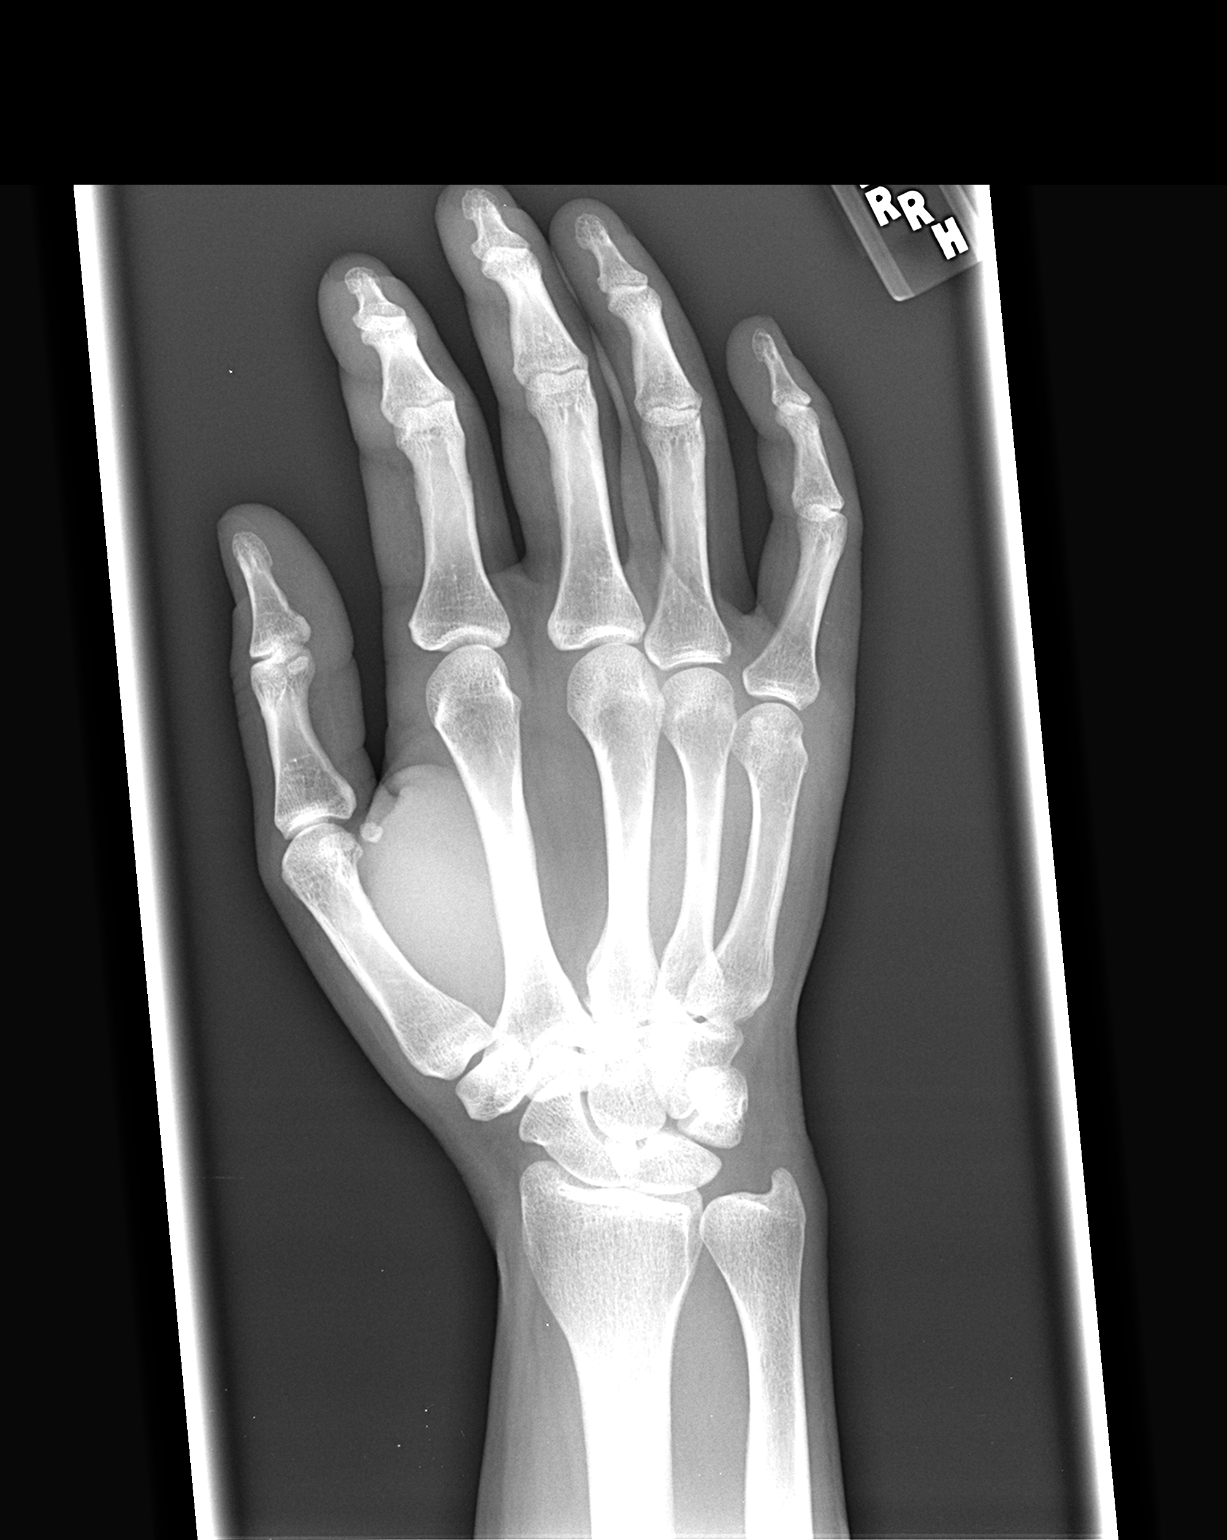

[view not recorded (3 of 3)]
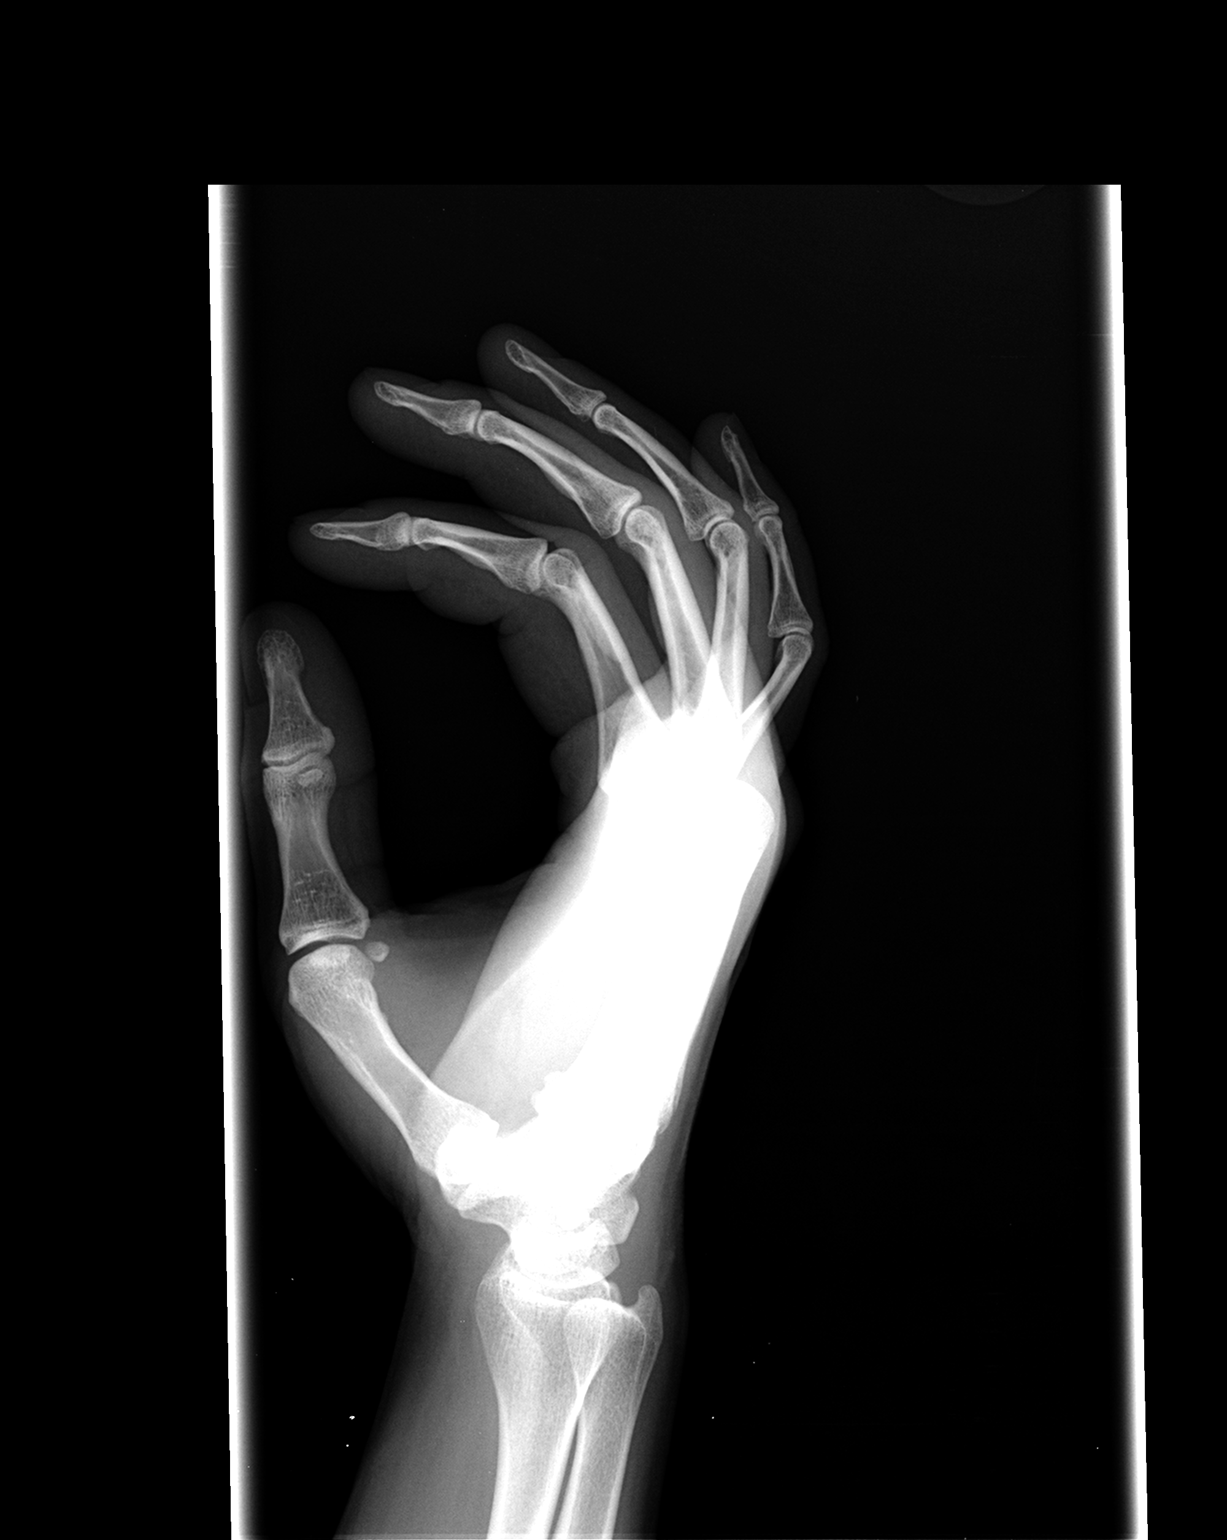

[3 of 3 positions shown; findings below may reference images not displayed]

FINDINGS: The bones of the right hand are adequately mineralized. There is no
acute fracture nor dislocation. The interphalangeal and
metacarpophalangeal joints exhibit no acute abnormalities or
significant degenerative change. The soft tissues are unremarkable.
IMPRESSION: There is no acute bony abnormality of the right hand.
